# Patient Record
Sex: Male | Born: 1985 | Race: Black or African American | Hispanic: No | Marital: Married
Health system: Southern US, Community
[De-identification: ages and names within clinical notes are randomized; demographics above are authoritative.]

---

## 2019-03-19 ENCOUNTER — Other Ambulatory Visit: Payer: Self-pay | Admitting: Orthopedic Surgery

## 2019-03-19 DIAGNOSIS — M25562 Pain in left knee: Secondary | ICD-10-CM

## 2019-04-15 ENCOUNTER — Other Ambulatory Visit: Payer: Self-pay

## 2019-04-15 ENCOUNTER — Ambulatory Visit
Admission: RE | Admit: 2019-04-15 | Discharge: 2019-04-15 | Disposition: A | Discharge status: Home / Self Care | Payer: BC Managed Care – PPO | Source: Ambulatory Visit | Attending: Orthopedic Surgery | Admitting: Orthopedic Surgery

## 2019-04-15 DIAGNOSIS — M25562 Pain in left knee: Secondary | ICD-10-CM

## 2019-06-08 ENCOUNTER — Other Ambulatory Visit: Payer: Self-pay

## 2019-06-08 DIAGNOSIS — Z20822 Contact with and (suspected) exposure to covid-19: Secondary | ICD-10-CM

## 2019-06-09 LAB — NOVEL CORONAVIRUS, NAA: SARS-CoV-2, NAA: NOT DETECTED

## 2019-08-11 ENCOUNTER — Ambulatory Visit: Payer: BC Managed Care – PPO | Attending: Internal Medicine

## 2019-08-11 DIAGNOSIS — Z20822 Contact with and (suspected) exposure to covid-19: Secondary | ICD-10-CM

## 2019-08-13 LAB — NOVEL CORONAVIRUS, NAA: SARS-CoV-2, NAA: NOT DETECTED

## 2019-08-18 ENCOUNTER — Ambulatory Visit: Payer: BC Managed Care – PPO | Attending: Internal Medicine

## 2019-08-18 DIAGNOSIS — Z20822 Contact with and (suspected) exposure to covid-19: Secondary | ICD-10-CM

## 2019-08-19 LAB — NOVEL CORONAVIRUS, NAA: SARS-CoV-2, NAA: NOT DETECTED

## 2020-12-18 IMAGING — MR MRI OF THE LEFT KNEE WITHOUT CONTRAST
6 series · 37 of 40 positions shown · non-contrast
Comparison: None.

CLINICAL DATA: Knee pain and swelling

EXAM:
MRI OF THE LEFT KNEE WITHOUT CONTRAST
TECHNIQUE: Multiplanar, multisequence MR imaging of the knee was performed. No
intravenous contrast was administered.

[Series 6: T2 fat-sat · axial · left · 4.0mm · 0.50mm/px · z∈[-45,+69]mm · 5 of 27 slices shown (1 of 3)]
[im 1/27]
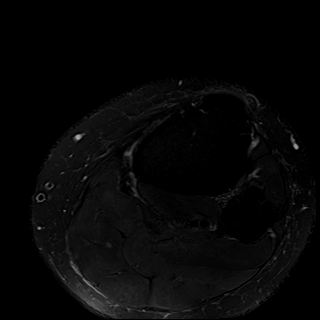
[im 7/27]
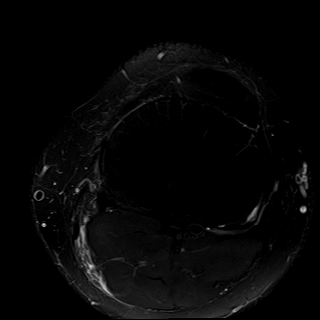
[im 14/27]
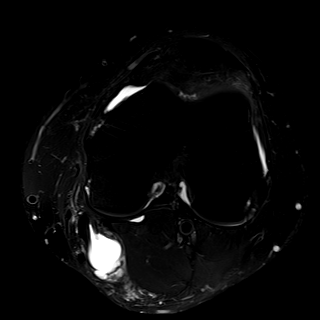
[im 20/27]
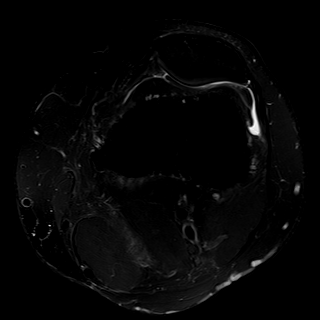
[im 27/27]
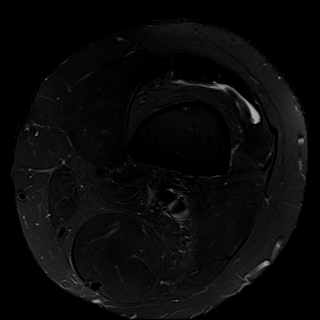

[Series 7: T2 fat-sat · coronal · left · 4.0mm · 0.39mm/px · 7 of 28 slices shown (2 of 3)]
[im 1/28]
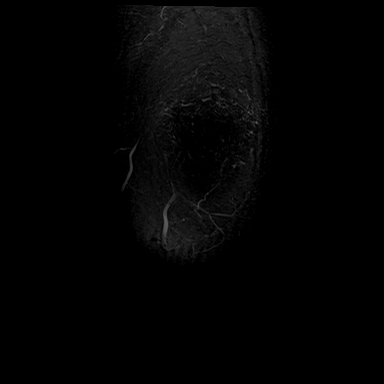
[im 5/28]
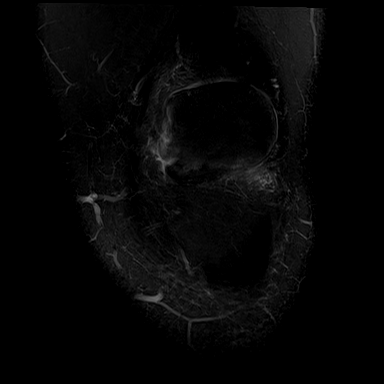
[im 10/28]
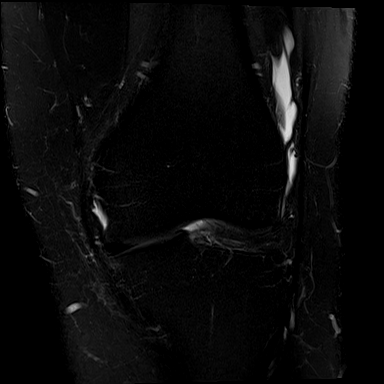
[im 14/28]
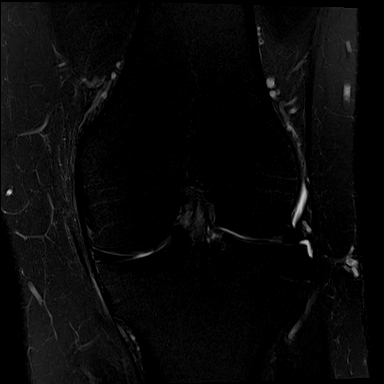
[im 19/28]
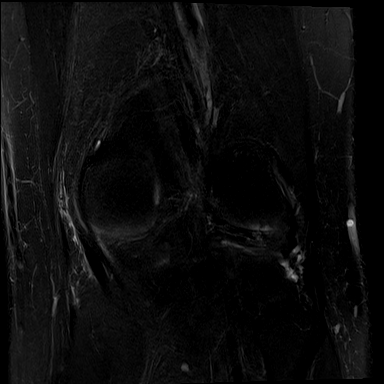
[im 23/28]
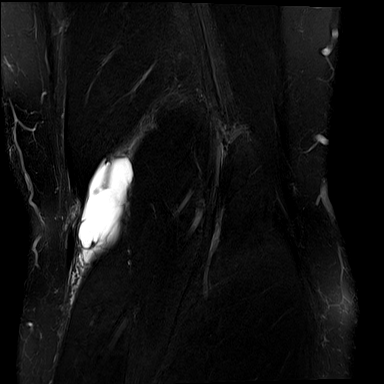
[im 28/28]
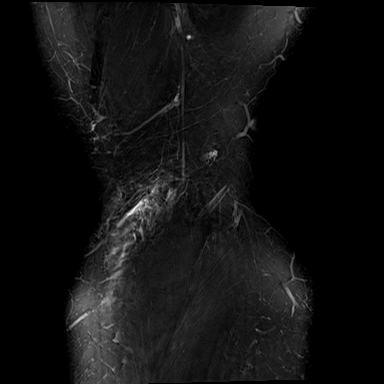

[Series 8: T1 · coronal · left · 4.0mm · 0.39mm/px · 4 of 28 slices shown]
[im 1/28]
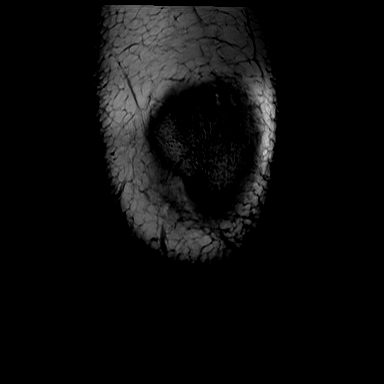
[im 5/28]
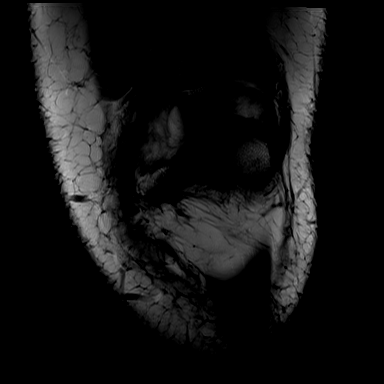
[im 10/28]
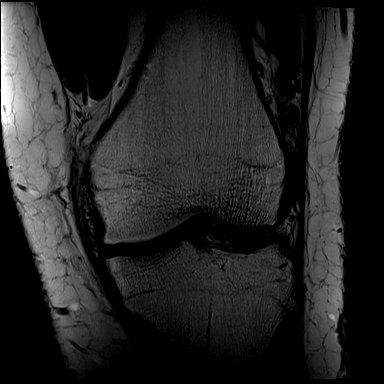
[im 14/28]
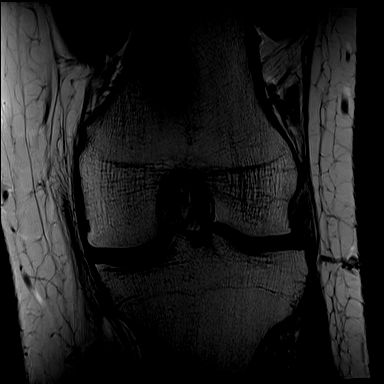

[Series 9: PD fat-sat · coronal · left · 3.0mm · 0.47mm/px · 7 of 28 slices shown (1 of 2)]
[im 1/28]
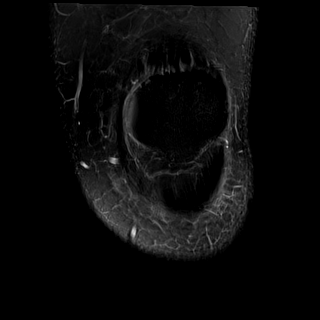
[im 5/28]
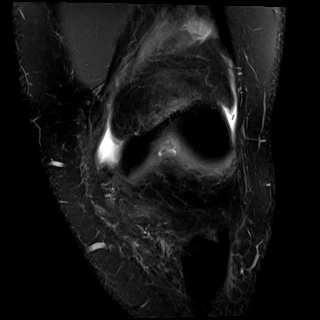
[im 10/28]
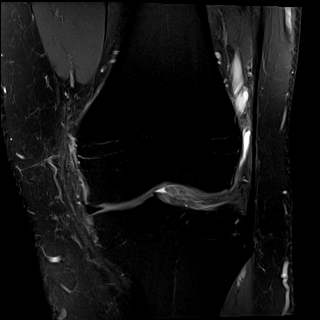
[im 14/28]
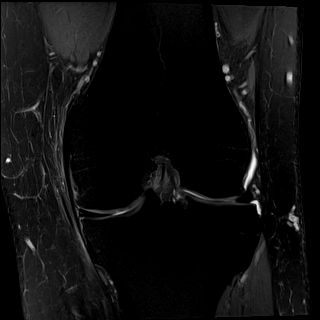
[im 19/28]
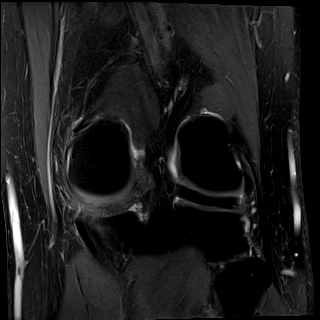
[im 23/28]
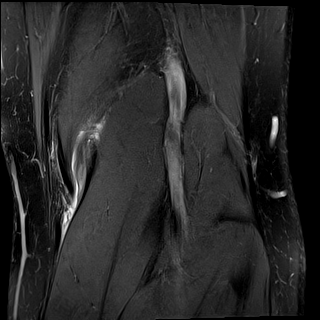
[im 28/28]
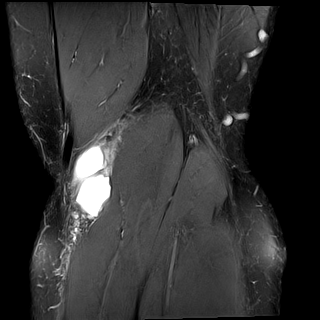

[Series 11: T2 fat-sat · sagittal · left · 3.0mm · 0.44mm/px · 7 of 28 slices shown (3 of 3)]
[im 1/28]
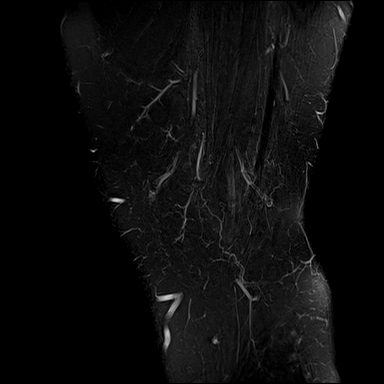
[im 5/28]
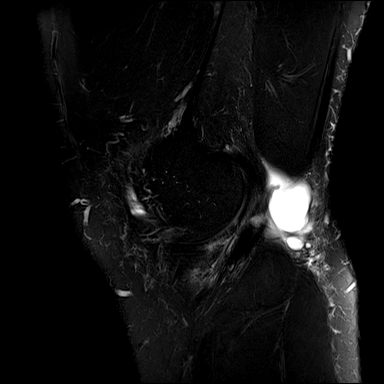
[im 10/28]
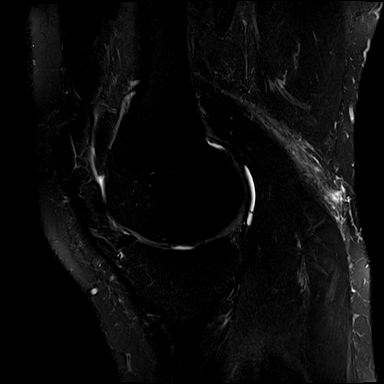
[im 14/28]
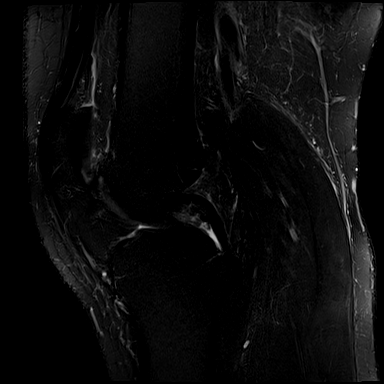
[im 19/28]
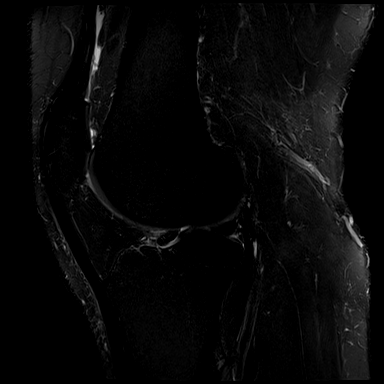
[im 23/28]
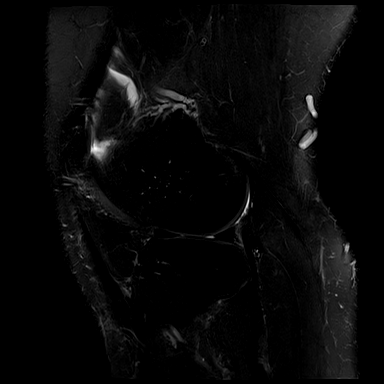
[im 28/28]
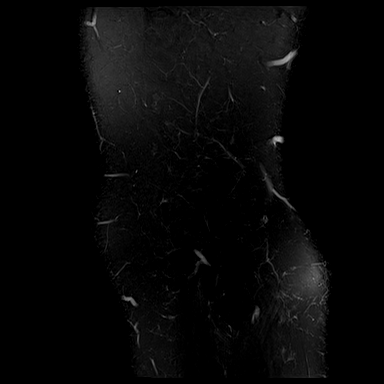

[Series 12: PD fat-sat · sagittal · left · 3.0mm · 0.44mm/px · 7 of 29 slices shown (2 of 2)]
[im 1/29]
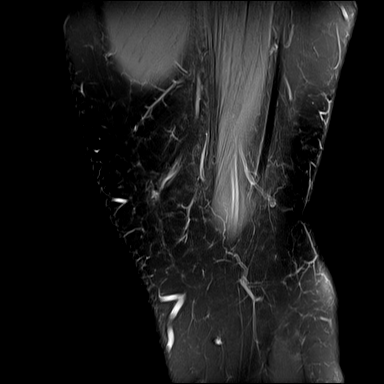
[im 5/29]
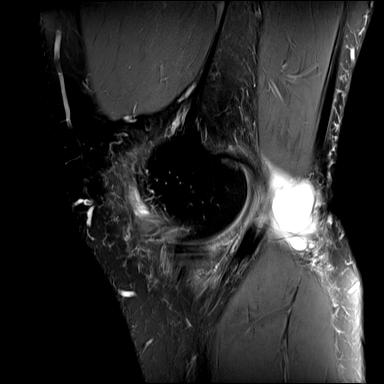
[im 10/29]
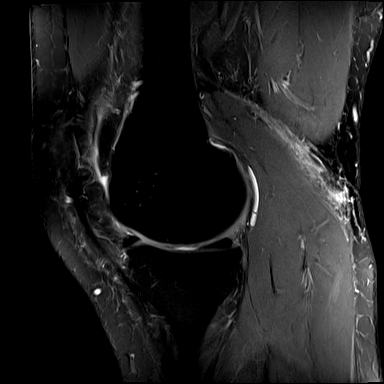
[im 15/29]
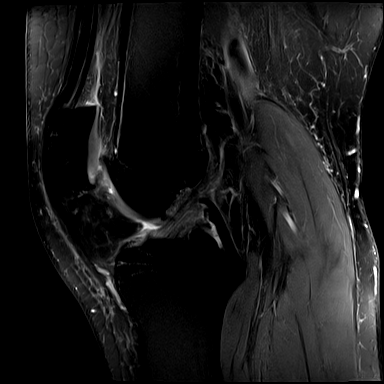
[im 19/29]
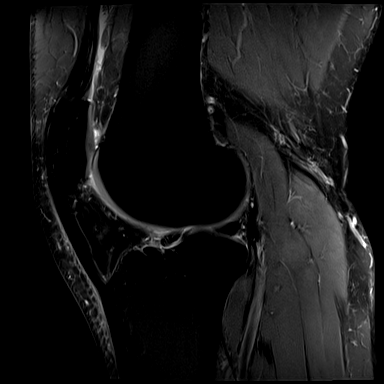
[im 24/29]
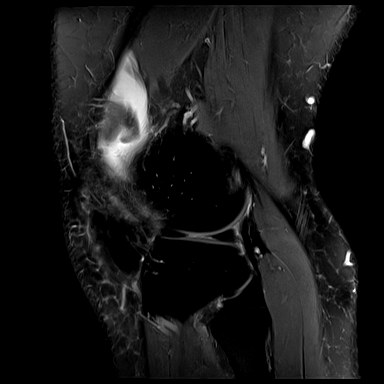
[im 29/29]
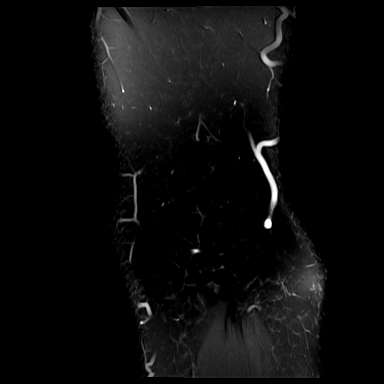

[37 of 40 positions shown; findings below may reference images not displayed]

FINDINGS: MENISCI

Medial meniscus:  Intact.

Lateral meniscus: Blunting and fraying of the free edge of the body
segment (series 9, images 13-14).

LIGAMENTS

Cruciates:  Intact ACL and PCL.

Collaterals: Medial collateral ligament is intact. Lateral
collateral ligament complex is intact.

CARTILAGE

Patellofemoral: Focal full-thickness defect within the mid trochlear
groove measuring approximately 3 x 5 mm with delamination (series
11, image 16). Small partial thickness cartilage fissure of the
opposing inferior patellar apex.

Medial: Partial-thickness cartilage defect on the central
weight-bearing medial femoral condyle measuring 8 mm AP x 6 mm TR
(series 11, image 10).

Lateral:  No chondral defect.

Joint: Small knee joint effusion with small intra-articular loose
bodies. Normal fat pads.

Popliteal Fossa: Moderate-sized complex Baker's cyst with evidence
of rupture. Intact popliteus tendon.

Extensor Mechanism:  Intact quadriceps tendon and patellar tendon.

Bones: No focal marrow signal abnormality. No fracture or
dislocation.

Other: None.
IMPRESSION: 1. High-grade cartilage abnormalities of the medial and
patellofemoral compartments including a focal full-thickness
cartilage defect with delamination involving the central trochlea.
2. Free edge blunting/fraying of the lateral meniscal body.
3. Moderate-sized complex Baker's cyst with evidence of rupture.
4. Small joint effusion with small intra-articular loose bodies.

## 2021-01-04 ENCOUNTER — Ambulatory Visit: Payer: BC Managed Care – PPO | Attending: Orthopaedic Surgery | Admitting: Physical Therapy

## 2021-01-04 ENCOUNTER — Other Ambulatory Visit: Payer: Self-pay

## 2021-01-04 ENCOUNTER — Encounter: Payer: Self-pay | Admitting: Physical Therapy

## 2021-01-04 DIAGNOSIS — M6281 Muscle weakness (generalized): Secondary | ICD-10-CM | POA: Diagnosis present

## 2021-01-04 DIAGNOSIS — R2689 Other abnormalities of gait and mobility: Secondary | ICD-10-CM | POA: Insufficient documentation

## 2021-01-04 DIAGNOSIS — M25572 Pain in left ankle and joints of left foot: Secondary | ICD-10-CM | POA: Diagnosis not present

## 2021-01-04 DIAGNOSIS — R262 Difficulty in walking, not elsewhere classified: Secondary | ICD-10-CM | POA: Diagnosis present

## 2021-01-04 DIAGNOSIS — M25672 Stiffness of left ankle, not elsewhere classified: Secondary | ICD-10-CM | POA: Insufficient documentation

## 2021-01-04 NOTE — Therapy (Addendum)
[facility] [address]  [place], [place], [address] Phone: [phone]   Fax:  [phone]  Physical Therapy Evaluation  Patient Details  Name: Veasna Santibanez. MRN: [id number] Date of Birth: 1986-06-11 Referring Provider (PT): Melony Overly, MD   Encounter Date: 01/04/2021   PT End of Session - 01/04/21 1048     Visit Number 1    Number of Visits 17    Date for PT Re-Evaluation 03/01/21    Authorization Type [employer]    PT Start Time 0759    PT Stop Time 0843    PT Time Calculation (min) 44 min    Activity Tolerance Patient tolerated treatment well    Behavior During Therapy [facility] for tasks assessed/performed             History reviewed. No pertinent past medical history.  History reviewed. No pertinent surgical history.  There were no vitals filed for this visit.    Subjective Assessment - 01/04/21 0800     Subjective Patient reports undergoing L Achilles repair on 12/05/20 after an injury during basketball. Was in a cast for 2 weeks and now in walking boot for the past 2 weeks. Only recently started [facility] in the boot. Notes that he was instructed on removing 1 wedge a week, which he has done once. Notes pain is fine unless he puts foot down accidently without his boot on. Using knee scooter for longer distances, especially at work. Pain occurs over the back of the Achilles and over L medial and lateral malleolus. Notes intermittent N/T in the toes. Unsure if he is supposed to WB on his foot.    Pertinent History none    Limitations Lifting;Standing;Walking;House hold activities    How long can you sit comfortably? variable    How long can you stand comfortably? variable    How long can you walk comfortably? variable    Diagnostic tests none recent    Patient Stated Goals return to activity    Currently in Pain? Yes    Pain Score 3     Pain Location Heel    Pain Orientation Left    Pain  Descriptors / Indicators Sharp;Throbbing    Pain Type Acute pain;Surgical pain                [facility] PT Assessment - 01/04/21 0809       Assessment   Medical Diagnosis s/p L achilles repair    Referring Provider (PT) Melony Overly, MD    Onset Date/Surgical Date 12/05/20    Next MD Visit 01/29/21    Prior Therapy yes- several yester ago      Restrictions   Other Position/Activity Restrictions in walking boot with 3 heel wedges      Balance Screen   Has the patient fallen in the past 6 months No    Has the patient had a decrease in activity level because of a fear of falling?  No    Is the patient reluctant to leave their home because of a fear of falling?  No      Home Environment   Living Environment Private residence    Living Arrangements Spouse/significant other;Children    Available Help at Discharge Family    Type of [place] Access Level entry    [place] Two level    Alternate Level Stairs-Number of Steps 15    Alternate Level Stairs-Rails Right  Home Equipment Crutches   knee scooter     Prior Function   Level of Independence Independent    Vocation Full time employment    Vocation Requirements principal- walking, sitting    Leisure working out, running, basketball      Cognition   Overall Cognitive Status Within Functional Limits for tasks assessed      Observation/Other Assessments   Observations L foot in cam boot; L achilles incision well-healing and without warmth, redness, drainage      Sensation   Light Touch Appears Intact      Coordination   Gross Motor Movements are Fluid and Coordinated Yes      Posture/Postural Control   Posture/Postural Control Postural limitations    Postural Limitations Rounded Shoulders;Weight shift left      ROM / Strength   AROM / PROM / Strength AROM;Strength      AROM   AROM Assessment Site Ankle    Right/Left Ankle Right;Left    Right Ankle Dorsiflexion 9    Right Ankle Plantar Flexion  62    Right Ankle Inversion 22    Right Ankle Eversion 16    Left Ankle Dorsiflexion -5    Left Ankle Plantar Flexion 45    Left Ankle Inversion 18    Left Ankle Eversion 12      Strength   Strength Assessment Site Hip;Knee;Ankle    Right/Left Hip Right;Left    Right Hip Flexion 4+/5    Right Hip Extension 4/5    Right Hip ABduction 4+/5    Right Hip ADduction 4+/5    Left Hip Flexion 4+/5    Left Hip Extension 4/5    Left Hip ABduction 4+/5    Left Hip ADduction 4+/5    Right/Left Knee Right;Left    Right Knee Flexion 4+/5    Right Knee Extension 4+/5    Left Knee Flexion 4+/5    Left Knee Extension 4+/5    Right/Left Ankle Right;Left    Right Ankle Dorsiflexion 4+/5    Right Ankle Inversion 4/5    Right Ankle Eversion 4+/5    Left Ankle Dorsiflexion 2+/5    Left Ankle Inversion 2+/5    Left Ankle Eversion 2+/5      Palpation   Palpation comment mildly edematous over L lower leg and ankle without tenderness or excessive tightness; L calf atrophy      Ambulation/Gait   Assistive device Crutches    Gait Pattern Step-through pattern;Step-to pattern   L foot in CAM boot partial WBing                       Objective measurements completed on examination: See above findings.               PT Education - 01/04/21 1048     Education Details prognosis, POC, HEP; advised to follow MD's post surgical instructions and educated on avoiding dorsiflexion past neutral    Person(s) Educated Patient    Methods Explanation;Demonstration;Tactile cues;Verbal cues;Handout    Comprehension Verbalized understanding;Returned demonstration              PT Short Term Goals - 01/04/21 1054       PT SHORT TERM GOAL #1   Title Patient to be independent with initial HEP.    Time 3    Period Weeks    Status New    Target Date 01/25/21  PT Long Term Goals - 01/04/21 1054       PT LONG TERM GOAL #1   Title Patient to be independent  with advanced HEP.    Time 8    Period Weeks    Status New    Target Date 03/01/21      PT LONG TERM GOAL #2   Title Patient to demonstrate B ankle strength >/=4+/5.    Time 8    Period Weeks    Status New    Target Date 03/01/21      PT LONG TERM GOAL #3   Title Patient to demonstrate L ankle AROM WFL and without pain limiting.    Time 8    Period Weeks    Status New    Target Date 03/01/21      PT LONG TERM GOAL #4   Title Patient to ambulate with symmetrical step length, weight shift, and DF at heel strike without AD.    Time 8    Period Weeks    Status New    Target Date 03/01/21      PT LONG TERM GOAL #5   Title Patient to return to fitness activities with modifications as needed.    Time 8    Period Weeks    Status New    Target Date 03/01/21                    Plan - 01/04/21 1049     Clinical Impression Statement Patient is a 35 y/o M presenting to OPPT with c/o L Achilles and ankle pain s/p L Achilles tendon repair on 12/05/20. Patient now in cam boot with 3 heel wedges. Ambulating with PWBing on L LE with crutches; knee scooter for longer distances. Pain is located in L Achilles and medial and latera malleolus; worse when WBing without boot. Patient today presenting with limited L ankle ROM, limited L ankle strength, L lower leg and diffuse ankle edema without tenderness or excessive tightness, L calf atrophy, and gait deviations. Patient was educated on gentle ankle AROM HEP and post-op precautions- patient reported understanding. Would benefit from skilled PT services 2x/week for 8 weeks to address aforementioned impairments.    Personal Factors and Comorbidities Fitness;Past/Current Experience;Profession;Time since onset of injury/illness/exacerbation    Examination-Activity Limitations Sit;Squat;Stairs;Caring for Others;Carry;Stand;Dressing;Transfers;Hygiene/Grooming;Lift;Locomotion Level    Examination-Participation Restrictions  Church;Cleaning;Community Activity;Laundry;Meal Prep;Occupation;Yard Work;Shop    Stability/Clinical Decision Making Stable/Uncomplicated    Clinical Decision Making Low    Rehab Potential Good    PT Frequency 2x / week    PT Duration 8 weeks    PT Treatment/Interventions ADLs/Self Care Home Management;Cryotherapy;Electrical Stimulation;Iontophoresis 87m/ml Dexamethasone;Moist Heat;Balance training;Therapeutic exercise;Therapeutic activities;Functional mobility training;Stair training;Gait training;Ultrasound;Neuromuscular re-education;Patient/family education;Manual techniques;Vasopneumatic Device;Taping;Energy conservation;Dry needling;Passive range of motion;Scar mobilization    PT Next Visit Plan reassess HEP; progress L ankle ROM to tolerance while avoiding DF past neutral until further MD instruction    Consulted and Agree with Plan of Care Patient             Patient will benefit from skilled therapeutic intervention in order to improve the following deficits and impairments:  Abnormal gait,Hypomobility,Increased edema,Decreased scar mobility,Decreased knowledge of precautions,Decreased activity tolerance,Decreased strength,Increased fascial restricitons,Pain,Decreased balance,Difficulty walking,Increased muscle spasms,Decreased range of motion,Postural dysfunction,Impaired flexibility  Visit Diagnosis: Pain in left ankle and joints of left foot  Stiffness of left ankle, not elsewhere classified  Muscle weakness (generalized)  Other abnormalities of gait and mobility  Difficulty in walking, not elsewhere classified  Problem List There are no problems to display for this patient.    Janene Harvey, PT, DPT 01/04/21 10:57 AM   [facility] [address]  [place], [place], [address] Phone: [phone]   Fax:  [phone]  Name: Shilo Pauwels. MRN: [id number] Date of Birth:  [birth year]   PHYSICAL THERAPY DISCHARGE SUMMARY  Visits from Start of Care: 1  Current functional level related to goals / functional outcomes: See above clinical impression; patient did not return d/t finances   Remaining deficits: See above   Education / Equipment: HEP  Plan: Patient agrees to discharge.  Patient goals were not met. Patient is being discharged due to finances.    Janene Harvey, PT, DPT 02/09/21 10:22 AM
